# Patient Record
Sex: Female | Born: 2000 | Race: White | Hispanic: No | Marital: Single
Health system: Midwestern US, Community
[De-identification: ages and names within clinical notes are randomized; demographics above are authoritative.]

## PROBLEM LIST (undated history)

## (undated) DIAGNOSIS — R7989 Other specified abnormal findings of blood chemistry: Principal | ICD-10-CM

## (undated) HISTORY — PX: TONSILLECTOMY AND ADENOIDECTOMY: SUR1326

---
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
## 2023-03-19 ENCOUNTER — Encounter: Payer: PRIVATE HEALTH INSURANCE | Attending: Women's Health | Primary: Diagnostic Radiology

## 2023-03-19 NOTE — Progress Notes (Deleted)
 Patient presents to the office as a new patient to discuss PCOS.    OB History    No obstetric history on file.       Last pap: N/A  Last mammogram: N/A  Last colonoscopy: N/A  Last DEXA: N/A    GYN History           No LMP recorded. Cycle Length {Numbers; 0-100:15068} Lasting {Numbers; 0-10:33138}  {Pos/neg trace:60575} dysmenorrhea; {Pos/neg trace:60575} postcoital bleeding    Past Medical History:  No past medical history on file.    Past Surgical History:  No past surgical history on file.    Allergies:   Not on File    Medication History:  No current outpatient medications on file.     No current facility-administered medications for this visit.       Social History:  Social History     Socioeconomic History    Marital status: Not on file     Spouse name: Not on file    Number of children: Not on file    Years of education: Not on file    Highest education level: Not on file   Occupational History    Not on file   Tobacco Use    Smoking status: Not on file    Smokeless tobacco: Not on file   Substance and Sexual Activity    Alcohol use: Not on file    Drug use: Not on file    Sexual activity: Not on file   Other Topics Concern    Not on file   Social History Narrative    Not on file     Social Determinants of Health     Financial Resource Strain: Not on file   Food Insecurity: Not on file   Transportation Needs: Not on file   Physical Activity: Not on file   Stress: Not on file   Social Connections: Not on file   Intimate Partner Violence: Not on file   Housing Stability: Not on file       Family History:  No family history on file.    Review of Systems - General ROS: negative except for that discussed in HPI      ROS:  Feeling well. No dyspnea or chest pain on exertion.  No abdominal pain, change in bowel habits, black or bloody stools.  No urinary tract symptoms. No neurological complaints.    Objective:   There were no vitals taken for this visit.  The patient appears well, alert, oriented x 3, in no  distress.  ENT normal.  Neck supple. No adenopathy or thyromegaly.   Lungs:  clear, good air entry, no wheezes, rhonchi or rales.   Heart:  S1 and S2 normal, no murmurs, regular rate and rhythm.  Abdomen:  soft without tenderness, guarding, mass or organomegaly.   Extremities show no edema, normal peripheral pulses.   Neurological is normal, no focal findings.    BREAST EXAM: {pe breast exam:315056::"breasts appear normal, no suspicious masses, no skin or nipple changes or axillary nodes"}    PELVIC EXAM: {pelvic exam:315900::"normal external genitalia, vulva, vagina, cervix, uterus and adnexa"}    Assessment/Plan:     There are no diagnoses linked to this encounter.     {gyn plan:315269::"mammogram","pap smear","return annually or prn"}    Supervising physician is Dr. Marland Kitchen

## 2023-04-17 ENCOUNTER — Ambulatory Visit
Admit: 2023-04-17 | Discharge: 2023-04-17 | Payer: PRIVATE HEALTH INSURANCE | Attending: Obstetrics & Gynecology | Primary: Diagnostic Radiology

## 2023-04-17 NOTE — Progress Notes (Signed)
 Christina Barry (DOB: [birth year]) is a 23 y.o. G0P0000, New patient, here for evaluation of the following chief complaint(s):    New Patient      HPI:  "I think I have PCOS."   She saw her derm for HS, she states that her derm told her that she may have PCOS based on her sx.   She c/o extra chin hair, cysts on her skin, random weight gain  She is currently on OCP for HS, she is on [place]- has a period every 3 months    ASSESSMENT/PLAN:  1. Class 3 severe obesity due to excess calories without serious comorbidity with body mass index (BMI) of 40.0 to 44.9 in adult  Overview:  04/17/23: BMI 44, weight 267. Does not exercise, does not watch her diet. 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  Plan:  Weight loss goal 250  Referral to dietician  Track calories - goal <1200 kcal per day  Exercise - 30 minutes walking 5 days per week  If fails conservative treatment- will prescribe a weight loss medication  2. Dysmenorrhea  Overview:  04/17/23: reports very painful periods since menarche. Periods are regular, monthly (sometimes off by a few days), denies skipping months. She c/o extra chin hair, cysts on her skin, random weight gain. She is currently on OCP for HS, she is on [place] for the past 4 months- has a period every 3 months (skips 4th week for 3 months at a time). She is happy with the Lowella Bandy and denies side effects. Not sure if she has PCOS  Plan:  OK to take Cosentix for HS  Discussed cannot diagnose PCOS while on OCPs- would need to come off for 3 months to confirm diagnosis. Discussed treatment for PCOS is OCP       Return in about 3 months (around 07/15/2023) for weight loss follow up.    OBJECTIVE:  BP 112/64 (Site: Left Upper Arm, Position: Sitting)   Ht 1.651 m (5\' 5" )   Wt 121.2 kg (267 lb 3.2 oz)   LMP 01/15/2023 (Approximate)   BMI 44.46 kg/m      Past Medical History:   Diagnosis Date    Anxiety     Depression     Hidradenitis suppurativa       Current  Outpatient Medications   Medication Sig Dispense Refill    drospirenone-ethinyl estradiol (NIKKI) 3-0.02 MG per tablet Take 1 tablet by mouth daily       No current facility-administered medications for this visit.     No Known Allergies  Social History     Socioeconomic History    Marital status: Single     Spouse name: Not on file    Number of children: Not on file    Years of education: Not on file    Highest education level: Not on file   Occupational History    Not on file   Tobacco Use    Smoking status: Never    Smokeless tobacco: Never   Vaping Use    Vaping status: Never Used   Substance and Sexual Activity    Alcohol use: Not Currently    Drug use: Never    Sexual activity: Not Currently     Partners: Female   Other Topics Concern    Not on file   Social History Narrative    Not on file     Social Determinants of Health  Financial Resource Strain: Not on file   Food Insecurity: Not on file   Transportation Needs: Not on file   Physical Activity: Not on file   Stress: Not on file   Social Connections: Not on file   Intimate Partner Violence: Not on file   Housing Stability: Not on file      Past Surgical History:   Procedure Laterality Date    TONSILLECTOMY        OB History   Gravida Para Term Preterm AB Living   0 0 0 0 0 0   SAB IAB Ectopic Molar Multiple Live Births   0 0 0 0 0 0          Review of Systems    OBGyn Exam       On this date 04/17/2023 I have spent 20 minutes reviewing previous notes, test results and face to face with the patient discussing the diagnosis and importance of compliance with the treatment plan as well as documenting on the day of the visit.      An electronic signature was used to authenticate this note.    --Ardith Dark, MD

## 2023-06-30 NOTE — Nursing Note (Signed)
 Discharge Note: Discharge completed by this Discharge RN. Patient A&O X 4, respirations present. Vital signs within baseline for patient. Pt and family educated on AVS including discharge instructions, medications, follow up, 24 hour triage number, and given a chance to ask any questions. IV's removed per order. Pt agreeable to discharge at this time. Primary RN advised of discharge and agreeable to discharge RN completing discharge at this time. Pt not removed from room by discharge RN.

## 2023-06-30 NOTE — Initial Assessments (Signed)
 Acute Care Physical Therapy  [facility]         Initial Evaluation      ASSESSMENT  Assessment: 22 yof s/p fall from skateboard with skull fx and SDH seen for PT eval. Pt has discharge orders. Pt has no focal deficits and is mobilizing well. She does have a headache. She was receptive to education. No further skilled PT is indicated.            PT Disposition Recommendation: Home independently  Acute Follow Up Needed?: No              PLAN  Treatment/Interventions: Discontinue physical therapy    HPI  History of Present Illness(HPI): Pt s/p fall off skateboard with skull fx and SDH  PT ordered for: Evaluate and Treat    Problem List:  There are no active problems to display for this patient.    Past Medical History:   Medical History[1]    Past Surgical History:   Surgical History[2]    SUBJECTIVE  Subjective: Agreeable. Head hurts, but denies dizziness or weakness           GENERAL APPEARANCE (ON ARRIVAL)  Alarms: No  Reason for no alarm: low fall score        HOME ENVIRONMENT  Lives With:  (roommate at [facility], but will be going with family in [place] x 1 week)     PRIOR FUNCTION   Prior Function-Gait : Independent  Prior Function-Stairs: Independent  Prior Function-Transfer: Independent      VISION  Acute Vision Changes: No    SPEECH/HEARING  Speech: Appropriate    COGNITION  Arousal/Alertness: Alert, Cooperative  Memory: Appears intact  Orientation Level: Oriented x4  Following Commands: Follows all commands and directions without difficulty  Safety Judgment: Good awareness of safety precautions  Initiation: Appears intact    SENSATION  Light Touch: No apparent deficits      MOBILITY    Bed Mobility:    Supine to Sit: Independent  Sit to Supine: Independent  Transfers:    Sit to Stand : Independent  Stand to Sit: Independent  Gait:    Gait Distance: 400 feet  Gait Assistance: Independent  Assistive Device: None    Balance:    Sitting - Static: Independent  Sitting - Dynamic:  Independent  Standing - Static: Independent  Standing - Dynamic: Independent    STRENGTH/ROM ASSESSMENT: LOWER EXTREMITY    Scale: Strength 0-5    Lower Extremity Comments: WNL    STRENGTH/ROM ASSESSMENT: UPPER EXTREMITY    Scale: Strength 0-5    Upper Extremity Comments: WNL    ADDITIONAL TESTS & MEASUREMENTS COMPLETED DURING THIS VISIT  Six Click (Without Stairs)  Turning Over in Bed : 4  Moving From Lying on Back to Sitting on the Side of the Bed: 4  Moving to and from a Bed to a Chair: 4  Sitting down on and Standing up from a Chair with Arms: 4  Walking in Hospital Room: 4  Total Score: 20  CMS Percentage of Impairment: 0%  CMS Severity Modifier: CH    1=unable 2=a lot  3=a little 4=none    Results: 20/20    TREATMENT PERFORMED  Treatment Performed: Eval as above.    GENERAL APPEARANCE (ON EXIT)  Alarms: No  Reason for no alarm: low fall score    EDUCATION  Education Format: Verbal  Audience: Patient  Education Response: Verbalized understanding  Brain rest, signs and symptoms of worsening bleeding.  TIME SPENT WITH PATIENT  Evaluation Time: 8 minutes          LORI R TAGGART, PT            [1]  No past medical history on file.  [2]  No past surgical history on file.

## 2023-07-06 NOTE — Telephone Encounter (Signed)
 Patient following up on referral following a skateboarding accident    V00.131A (ICD-10-CM) - Fall from skateboard, initial encounter  S01.01XA (ICD-10-CM) - Laceration of scalp without foreign body, initial encounter

## 2023-07-16 ENCOUNTER — Encounter: Payer: PRIVATE HEALTH INSURANCE | Attending: Obstetrics & Gynecology | Primary: Diagnostic Radiology

## 2023-08-11 ENCOUNTER — Ambulatory Visit
Admit: 2023-08-11 | Discharge: 2023-08-11 | Payer: PRIVATE HEALTH INSURANCE | Attending: Obstetrics & Gynecology | Primary: Diagnostic Radiology

## 2023-08-11 VITALS — BP 122/82 | Ht 65.0 in | Wt 263.0 lb

## 2023-08-11 DIAGNOSIS — Z6841 Body Mass Index (BMI) 40.0 and over, adult: Secondary | ICD-10-CM

## 2023-08-11 DIAGNOSIS — E66813 Obesity, class 3 (HCC): Secondary | ICD-10-CM

## 2023-08-11 NOTE — Progress Notes (Signed)
 Christina Barry (DOB: [birth year]) is a 23 y.o. G0P0000, Established patient, here for evaluation of the following chief complaint(s):    Follow-up      HPI:  Here for weight loss follow up. She is not taking medicine, was last seen 04/17/23 and advised to track calories, exercise, and see a dietician.     ASSESSMENT/PLAN:  1. Class 3 severe obesity due to excess calories without serious comorbidity with body mass index (BMI) of 40.0 to 44.9 in adult [facility])  Overview:  04/17/23: BMI 44, weight 267. Does not exercise, does not watch her diet. 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  Plan:  Weight loss goal 250  Referral to dietician  Track calories - goal <1200 kcal per day  Exercise - 30 minutes walking 5 days per week  If fails conservative treatment- will prescribe a weight loss medication    08/11/23: BMI 43, weight 263 lbs, weight loss 4 lbs since last visit. Was doing well with diet and exercise until she had a skateboarding accident, she had staples placed on her scalp. Reports h/o eating disorder and disordered eating, calories per day vary from like [phone] per day. Denies binge eating.  Diet- counting calories, averaging 1200-1400 per day, eating chicken, rice, and broccoli. Does not eat very healthy when she does eat. She reports strict calorie counting.  Exercise- walking and skateboarding 30 mins per day, stopped when had her skateboarding exercise  Declined referral to dietician due to h/o eating disorder  Plan:  Pt not eating much at all but not losing weight. Celexa is not the cause- pt discontinued for a year and did not notice weight loss. Maybe thyroid disease, cushing syndrome. Pt does have PCOS.  Will obtain TSH and AM cortisol.   Diet- continue, doing well  Exercise- get back on that  Start medication - discussed Adipex, Contrave, Orlistat. Will start Orlistat 120 mg with eat meal, recommend obtaining OTC Alli.  Orders:  -     TSH; Future  -     Cortisol  AM, Total; Future       Return in about 3 months (around 11/11/2023) for weight loss follow up.    OBJECTIVE:  BP 122/82 (BP Site: Right Upper Arm, Patient Position: Sitting)   Ht 1.651 m (5' 5)   Wt 119.3 kg (263 lb)   BMI 43.77 kg/m      Past Medical History:   Diagnosis Date    Anxiety     Depression     Hidradenitis suppurativa     Trauma 06/28/23    Marvell Slider off a skateboard, cracked my skull open, had to staple my head shut      Current Outpatient Medications   Medication Sig Dispense Refill    citalopram (CELEXA) 20 MG tablet Take 1 tablet by mouth daily      spironolactone (ALDACTONE) 100 MG tablet Take 1 tablet by mouth daily      drospirenone-ethinyl estradiol (NIKKI) 3-0.02 MG per tablet Take 1 tablet by mouth daily       No current facility-administered medications for this visit.     No Known Allergies  Social History     Socioeconomic History    Marital status: Single     Spouse name: Not on file    Number of children: Not on file    Years of education: Not on file    Highest education level: Not on file  Occupational History    Not on file   Tobacco Use    Smoking status: Never    Smokeless tobacco: Never   Vaping Use    Vaping status: Never Used   Substance and Sexual Activity    Alcohol use: Not Currently    Drug use: Never    Sexual activity: Not Currently     Partners: Female   Other Topics Concern    Not on file   Social History Narrative    Not on file     Social Drivers of Health     Financial Resource Strain: Not At Risk (06/29/2023)    Received from [facility] - Financial Strain     Difficulty of Paying Living Expenses: Not hard at all   Food Insecurity: No Food Insecurity (06/29/2023)    Received from [facility]    Hunger Vital Sign     Worried About Running Out of Food in the Last Year: Never true     Ran Out of Food in the Last Year: Never true   Transportation Needs: Not At Risk (06/29/2023)    Received from [facility] - Transportation     Lack of Transportation: No    Physical Activity: Not on file   Stress: Not on file   Social Connections: Not At Risk (06/29/2023)    Received from [facility] - Social Connections     Needs Help with Day-to-Day Activities: I don't need any help     Frequency of Feeling Lonely or Isolated: Never   Intimate Partner Violence: Not on file   Housing Stability: Not At Risk (06/29/2023)    Received from [facility] - Inadequate Housing     Current Living Situation: I have a steady place to live     Housing Problems: None of the above      Past Surgical History:   Procedure Laterality Date    TONSILLECTOMY      WISDOM TOOTH EXTRACTION        OB History   Gravida Para Term Preterm AB Living   0 0 0 0 0 0   SAB IAB Ectopic Molar Multiple Live Births   0 0 0 0 0 0          Review of Systems    OBGyn Exam       On this date 08/11/2023 I have spent 20 minutes reviewing previous notes, test results and face to face with the patient discussing the diagnosis and importance of compliance with the treatment plan as well as documenting on the day of the visit.      An electronic signature was used to authenticate this note.    --Wilhelmina Harari, MD

## 2023-08-13 ENCOUNTER — Other Ambulatory Visit: Admit: 2023-08-13 | Discharge: 2023-08-13 | Payer: PRIVATE HEALTH INSURANCE | Primary: Diagnostic Radiology

## 2023-08-13 DIAGNOSIS — E66813 Obesity, class 3 (HCC): Secondary | ICD-10-CM

## 2023-08-13 DIAGNOSIS — Z6841 Body Mass Index (BMI) 40.0 and over, adult: Secondary | ICD-10-CM

## 2023-08-13 LAB — CORTISOL AM, TOTAL: Cortisol - AM: 28.6 ug/dL — ABNORMAL HIGH (ref 4.8–19.5)

## 2023-08-13 LAB — TSH: TSH, 3rd Generation: 3.1 u[IU]/mL (ref 0.270–4.200)

## 2023-08-17 ENCOUNTER — Encounter

## 2023-09-08 ENCOUNTER — Other Ambulatory Visit: Payer: Self-pay

## 2023-09-24 ENCOUNTER — Ambulatory Visit
Admit: 2023-09-24 | Discharge: 2023-09-24 | Payer: PRIVATE HEALTH INSURANCE | Attending: Student in an Organized Health Care Education/Training Program | Primary: Diagnostic Radiology

## 2023-09-24 VITALS — BP 130/86 | HR 81 | Ht 66.0 in | Wt 263.4 lb

## 2023-09-24 DIAGNOSIS — R7989 Other specified abnormal findings of blood chemistry: Principal | ICD-10-CM

## 2023-09-24 NOTE — Progress Notes (Signed)
 Christina CHARM Beal, DO    [facility]  [address], [address]  [place], [place] [address]        Christina Barry (DOB: Mar 13, 2000) is a New patient who is referred by Christina Moats, MD, for the evaluation and management of Elevated cortisol level.    NOTICE FOR THE PATIENT: This clinical note is not designed to be interpreted by patients.  These notes Christina Barry contain candid and (unintentionally) offensive descriptions, which are sometimes required for accurate documentation. If you would like more information about your healthcare, please obtain it directly by myself or my staff. Thank you for your understanding and cooperation.    Assessment & Plan  Elevated cortisol  BP 130/86, pulse 81, BMI 42. Random AM Cortisol 28.6 on 08/13/2023.   CT chest, abdomen, pelvis on 06/28/2023 showed normal adrenals.  Difficulty losing weight despite exercise and calorie-deficit diet. Reports easy bruising, purple stretch marks.  Diagnostic plan: Conduct late-night salivary cortisol test x 3.   If negative, no further action.   If positive, order dexamethasone suppression test.  Low suspicion for pathologic cortisol, likely physiologic in the setting of PCOS.    PCOS  Difficulty losing weight despite exercise and calorie-deficit diet.  BP 130/86, pulse 81, BMI 42. Potassium 3.8, glucose 100 (non-fasting), WBC 12.0.  Diagnostic plan: Consider A1c test for insulin resistance or diabetes. Consider 17-OHP to r/o CAH.  Treatment plan: Counsel on diet, exercise, and supplements for PCOS. Continue birth control, spironolactone, and citalopram. Consider inositol or berberine for insulin sensitivity.  - Recommend vitamin C 500 mg daily, zinc 50 mg daily, inositol or berberine for insulin sensitivity, vitamin D 2000 IU daily with high-fat meal for inflammation and PCOS.    HS: Stable  Continue spironolactone for HS and PCOS.    Follow-up: [next scheduled visit, other. Remove this row and header if blank].    Follow-up and  Dispositions    Return if symptoms worsen or fail to improve.       Orders Placed This Encounter    Salivary Cortisol X3, Timed     Collect between 11 PM and midnight on 3 consecutive nights     Standing Status:   Future     Expected Date:   09/24/2023     Expiration Date:   09/23/2024    CLINDAMYCIN PHOSPHATE,TOPICAL, 1 % SWAB     Sig: APPLY 1 APPLICATION AS DIRECTED TO AFFECTED AREAS DAILY AS NEEDED FOR FLARES.       Subjective    History of Present Illness  The patient, a 23 year old female, presents for evaluation of elevated cortisol levels.     Elevated Cortisol Levels  - On August 13, 2023, an 8 AM random cortisol measurement was 28.6 mcg/dL, and thyroid-stimulating hormone (TSH) was 3.1 mIU/L.  - On Christina Barry 15, 2025, laboratory results indicated potassium at 3.8 mmol/L, glucose at 100 mg/dL (non-fasting), and white blood cell count (WBC) at 12.0 x 10^9/L.  - The plan includes confirmatory testing for hypercortisolism.  - Computed tomography (CT) of the chest, abdomen, and pelvis on Christina Barry 4, 2025, revealed normal adrenal glands.    Polycystic Ovary Syndrome (PCOS)  - The patient has been diagnosed with polycystic ovary syndrome (PCOS) and reports difficulty with weight loss despite regular exercise.  - Testing has confirmed elevated cortisol levels.  - She has not been screened for insulin resistance or diabetes mellitus.  - The patient reports easy bruising and the presence of large purple striae.  -  She has lost weight from 165 to 163 pounds over six months through a calorie deficit diet and increased protein intake.  - She is not taking any supplements for inflammation related to PCOS.  - Her current workout regimen includes cardiovascular and core exercises on Monday, upper body weight training on Tuesday, cardiovascular exercises on Wednesday, lower body weight training on Thursday, upper body weight training on Friday, and cardiovascular exercises on Saturday.  - She consumes 100 grams of protein daily.  - The  patient reports persistent myalgia, particularly in the lower extremities, since initiating her workout routine 14 days ago.  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    Medications  - The patient is currently taking oral contraceptives, spironolactone, and citalopram.  - She administers her birth control medication at night.  - Spironolactone is prescribed for hidradenitis suppurativa.    Additional Information  - She has no diagnosis of hypertension.  - Dietary recall indicates an aversion to whole milk.    SOCIAL HISTORY  - Does not smoke  - Does not drink alcohol  - Does not use drugs    FAMILY HISTORY  - Mother has ulcerative colitis           Results  - Labs:    - Cortisol: 28.6 mcg/dL    - TSH: 3.1 mIU/L    - Potassium: 3.8 mmol/L    - Glucose: 100 mg/dL    - WBC: 87.9 k89^0/O    - Imaging:    - CT chest, abdomen, pelvis: Adrenals normal       Objective        BP 130/86 (BP Site: Right Lower Arm, Patient Position: Sitting, BP Cuff Size: Medium Adult)   Pulse 81   Ht 1.676 m (5' 6)   Wt 119.5 kg (263 lb 6.4 oz)   SpO2 99%   BMI 42.51 kg/m    BP Readings from Last 3 Encounters:   09/24/23 130/86   08/11/23 122/82   04/17/23 112/64      Wt Readings from Last 3 Encounters:   09/24/23 119.5 kg (263 lb 6.4 oz)   08/11/23 119.3 kg (263 lb)   04/17/23 121.2 kg (267 lb 3.2 oz)      Physical Exam  Vitals reviewed.   Constitutional:       General: She is not in acute distress.     Appearance: Normal appearance. She is obese. She is not ill-appearing.   HENT:      Head: Normocephalic and atraumatic.      Mouth/Throat:      Mouth: Mucous membranes are moist.   Eyes:      Extraocular Movements: Extraocular movements intact.      Pupils: Pupils are equal, round, and reactive to light.   Cardiovascular:      Rate and Rhythm: Normal rate and regular rhythm.      Heart sounds: Normal heart sounds.   Pulmonary:      Effort: Pulmonary effort is  normal.   Musculoskeletal:         General: No swelling. Normal range of motion.      Cervical back: Normal range of motion.      Comments: Able to sit-to-stand without assistance of UE   Skin:     General: Skin is warm and dry.      Comments: Stretch marks noted, no large purple striae. No  bruising at present.   Neurological:      Mental Status: She is alert and oriented to person, place, and time. Mental status is at baseline.   Psychiatric:         Mood and Affect: Mood normal.         Judgment: Judgment normal.              On this date 09/24/2023  I have spent 70 minutes reviewing previous notes, reviewing and interpreting prior test results, face to face with the patient, reviewing plan of care, and documenting on the day of the visit. More than 50% of the total time was spent face to face with patient on education, counseling, & coordination of care for the patient.    --Christina Cleverly Trinity Haun, DO   An electronic signature was used to authenticate this note.     Patient was asked and verbally agreed to the use of an Artificial Intelligence scribe during this appointment. Elements of this note Jock Mahon have also been dictated via voice recognition software. Text and phrases Aunika Kirsten be limited by the accuracy and autoconversion of the software. The chart has been reviewed, but errors Collene Massimino still be present.

## 2023-10-14 ENCOUNTER — Encounter

## 2023-10-28 LAB — SALIVARY CORTISOL X3, TIMED
Cortisol Saliva, Sample 1: 0.026 ug/dL
Cortisol Saliva, Sample 2: 0.012 ug/dL
Cortisol Saliva, Sample 4: 0.014 ug/dL

## 2023-11-18 ENCOUNTER — Encounter: Payer: PRIVATE HEALTH INSURANCE | Attending: Obstetrics & Gynecology | Primary: Diagnostic Radiology

## 2024-03-04 ENCOUNTER — Encounter: Payer: PRIVATE HEALTH INSURANCE | Attending: Obstetrics & Gynecology | Primary: Diagnostic Radiology
